# Patient Record
Sex: Male | Born: 1970 | Race: White | Hispanic: No | Marital: Single | Smoking: Current every day smoker
Health system: Southern US, Community
[De-identification: ages and names within clinical notes are randomized; demographics above are authoritative.]

## PROBLEM LIST (undated history)

## (undated) DIAGNOSIS — I10 Essential (primary) hypertension: Secondary | ICD-10-CM

## (undated) DIAGNOSIS — I2510 Atherosclerotic heart disease of native coronary artery without angina pectoris: Secondary | ICD-10-CM

## (undated) HISTORY — PX: CORONARY ANGIOPLASTY WITH STENT PLACEMENT: SHX49

---

## 2010-04-20 ENCOUNTER — Emergency Department (HOSPITAL_COMMUNITY)
Admission: EM | Admit: 2010-04-20 | Discharge: 2010-04-20 | Disposition: A | Discharge status: Home / Self Care | Payer: Self-pay | Attending: Emergency Medicine | Admitting: Emergency Medicine

## 2010-04-20 ENCOUNTER — Emergency Department (HOSPITAL_COMMUNITY): Payer: Self-pay

## 2010-04-20 DIAGNOSIS — R109 Unspecified abdominal pain: Secondary | ICD-10-CM | POA: Insufficient documentation

## 2010-04-20 DIAGNOSIS — Z87442 Personal history of urinary calculi: Secondary | ICD-10-CM | POA: Insufficient documentation

## 2010-04-20 DIAGNOSIS — R319 Hematuria, unspecified: Secondary | ICD-10-CM | POA: Insufficient documentation

## 2010-04-20 LAB — DIFFERENTIAL
Basophils Absolute: 0.1 10*3/uL (ref 0.0–0.1)
Basophils Relative: 1 % (ref 0–1)
Eosinophils Relative: 4 % (ref 0–5)
Monocytes Absolute: 0.6 10*3/uL (ref 0.1–1.0)
Monocytes Relative: 7 % (ref 3–12)
Neutro Abs: 5.5 10*3/uL (ref 1.7–7.7)

## 2010-04-20 LAB — CBC
HCT: 40.5 % (ref 39.0–52.0)
Hemoglobin: 13.3 g/dL (ref 13.0–17.0)
MCH: 29.2 pg (ref 26.0–34.0)
MCHC: 32.8 g/dL (ref 30.0–36.0)
RDW: 12.9 % (ref 11.5–15.5)

## 2010-04-20 LAB — URINALYSIS, ROUTINE W REFLEX MICROSCOPIC
Bilirubin Urine: NEGATIVE
Nitrite: NEGATIVE
Protein, ur: NEGATIVE mg/dL
Urobilinogen, UA: 0.2 mg/dL (ref 0.0–1.0)

## 2010-04-20 LAB — BASIC METABOLIC PANEL
CO2: 28 mEq/L (ref 19–32)
Calcium: 8.8 mg/dL (ref 8.4–10.5)
Creatinine, Ser: 1.22 mg/dL (ref 0.4–1.5)
GFR calc Af Amer: >60 mL/min (ref >60)
GFR calc non Af Amer: >60 mL/min (ref >60)
Glucose, Bld: 118 mg/dL — ABNORMAL HIGH (ref 70–99)
Sodium: 143 mEq/L (ref 135–145)

## 2019-07-31 ENCOUNTER — Emergency Department
Admission: EM | Admit: 2019-07-31 | Discharge: 2019-07-31 | Disposition: A | Discharge status: Home / Self Care | Payer: Self-pay | Attending: Emergency Medicine | Admitting: Emergency Medicine

## 2019-07-31 ENCOUNTER — Emergency Department: Payer: Self-pay

## 2019-07-31 ENCOUNTER — Other Ambulatory Visit: Payer: Self-pay

## 2019-07-31 DIAGNOSIS — R55 Syncope and collapse: Secondary | ICD-10-CM | POA: Insufficient documentation

## 2019-07-31 DIAGNOSIS — R42 Dizziness and giddiness: Secondary | ICD-10-CM

## 2019-07-31 DIAGNOSIS — F17210 Nicotine dependence, cigarettes, uncomplicated: Secondary | ICD-10-CM | POA: Insufficient documentation

## 2019-07-31 DIAGNOSIS — I2510 Atherosclerotic heart disease of native coronary artery without angina pectoris: Secondary | ICD-10-CM | POA: Insufficient documentation

## 2019-07-31 DIAGNOSIS — I10 Essential (primary) hypertension: Secondary | ICD-10-CM | POA: Insufficient documentation

## 2019-07-31 HISTORY — DX: Atherosclerotic heart disease of native coronary artery without angina pectoris: I25.10

## 2019-07-31 HISTORY — DX: Essential (primary) hypertension: I10

## 2019-07-31 LAB — URINALYSIS, COMPLETE (UACMP) WITH MICROSCOPIC
Bacteria, UA: NONE SEEN
Bilirubin Urine: NEGATIVE
Glucose, UA: NEGATIVE mg/dL
Hgb urine dipstick: NEGATIVE
Ketones, ur: NEGATIVE mg/dL
Leukocytes,Ua: NEGATIVE
Nitrite: NEGATIVE
Protein, ur: 30 mg/dL — AB
Specific Gravity, Urine: 1.024 (ref 1.005–1.030)
pH: 6 (ref 5.0–8.0)

## 2019-07-31 LAB — BASIC METABOLIC PANEL
Anion gap: 12 (ref 5–15)
BUN: 23 mg/dL — ABNORMAL HIGH (ref 6–20)
CO2: 27 mmol/L (ref 22–32)
Calcium: 9.3 mg/dL (ref 8.9–10.3)
Chloride: 102 mmol/L (ref 98–111)
Creatinine, Ser: 1.18 mg/dL (ref 0.61–1.24)
GFR calc Af Amer: >60 mL/min (ref >60)
GFR calc non Af Amer: >60 mL/min (ref >60)
Glucose, Bld: 110 mg/dL — ABNORMAL HIGH (ref 70–99)
Potassium: 4 mmol/L (ref 3.5–5.1)
Sodium: 141 mmol/L (ref 135–145)

## 2019-07-31 LAB — CBC
HCT: 46.1 % (ref 39.0–52.0)
Hemoglobin: 15.8 g/dL (ref 13.0–17.0)
MCH: 29.6 pg (ref 26.0–34.0)
MCHC: 34.3 g/dL (ref 30.0–36.0)
MCV: 86.3 fL (ref 80.0–100.0)
Platelets: 314 10*3/uL (ref 150–400)
RBC: 5.34 MIL/uL (ref 4.22–5.81)
RDW: 12.1 % (ref 11.5–15.5)
WBC: 10.7 10*3/uL — ABNORMAL HIGH (ref 4.0–10.5)
nRBC: 0 % (ref 0.0–0.2)

## 2019-07-31 LAB — TROPONIN I (HIGH SENSITIVITY)
Troponin I (High Sensitivity): 30 ng/L — ABNORMAL HIGH (ref <18)
Troponin I (High Sensitivity): 27 ng/L — ABNORMAL HIGH (ref <18)

## 2019-07-31 MED ORDER — SODIUM CHLORIDE 0.9% FLUSH
3.0000 mL | Freq: Once | INTRAVENOUS | Status: AC
  Administered 2019-07-31: 3 mL via INTRAVENOUS

## 2019-07-31 NOTE — ED Notes (Signed)
Pt unable to urinate at this time, given specimen cup for when is able to do so. Pt placed in [place] with two sheriffs.
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
## 2019-07-31 NOTE — ED Notes (Signed)
Patient with elevated BP. Katrinka Blazing, MD made aware.

## 2019-07-31 NOTE — ED Notes (Signed)
Patient provided a pitcher and encouraged to hydrate. He is observed drinking without any issues. He denies pain at this time but continues to c/o dizziness.

## 2019-07-31 NOTE — ED Triage Notes (Signed)
Pt comes into the ED via EMS , states the pt was in custody of Environmental consultant for probation violation and the pt c/o feeling faint . 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 Pt is a/ox4. Pt did arrive with forensic wrist restraints.

## 2019-07-31 NOTE — Discharge Instructions (Signed)
Return to the ED with any new chest pains, fevers or episodes of passing out.

## 2021-02-27 IMAGING — CT CT HEAD W/O CM
3 series · 15 of 47 positions shown, 18 images · non-contrast
Comparison: No pertinent prior studies available for comparison.

CLINICAL DATA: Head trauma, abnormal mental status.

EXAM:
CT HEAD WITHOUT CONTRAST
TECHNIQUE: Contiguous axial images were obtained from the base of the skull
through the vertex without intravenous contrast.

[Series 2: head wo · axial · 0.41mm/px · z∈[+622,+747]mm · 9 of 30 slices shown, 12 images]
[im 3/30  brain]
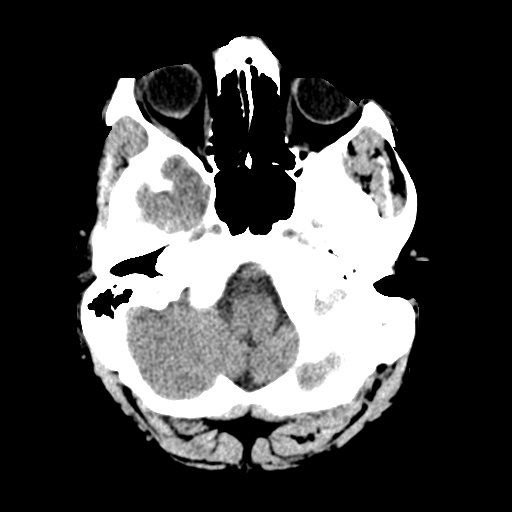
[im 3/30  bone]
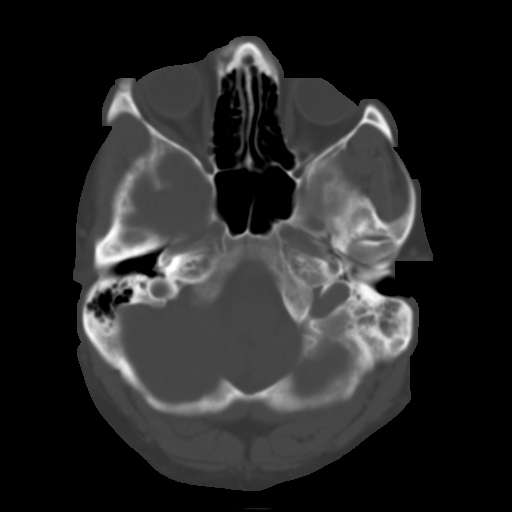
[im 6/30  brain]
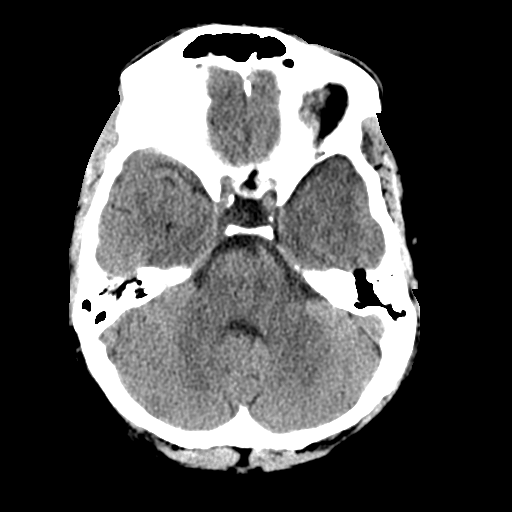
[im 9/30  brain]
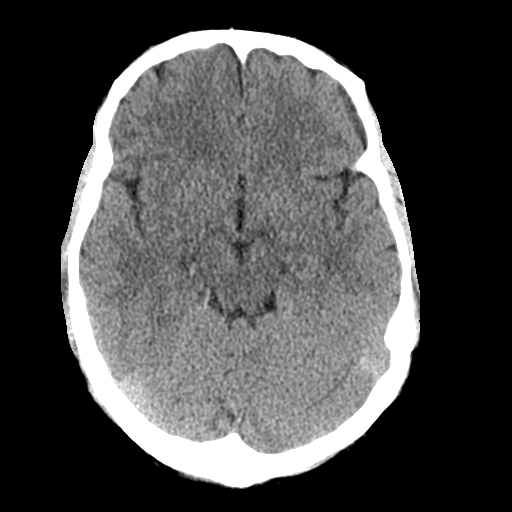
[im 12/30  brain]
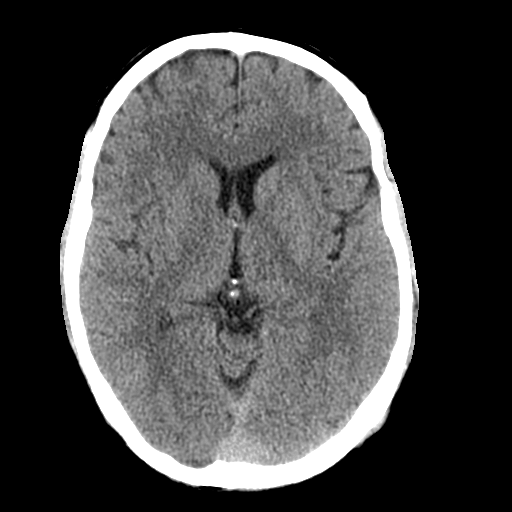
[im 16/30  brain]
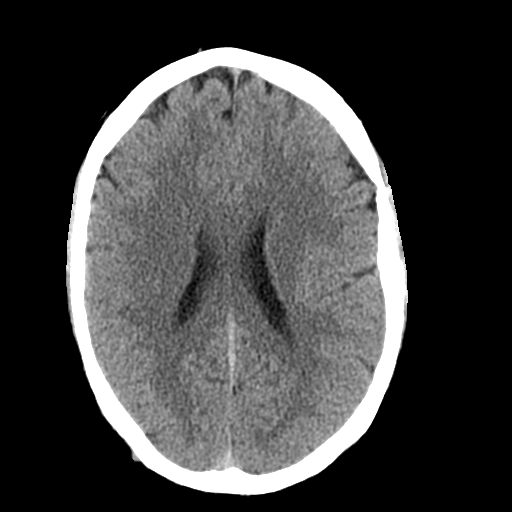
[im 16/30  bone]
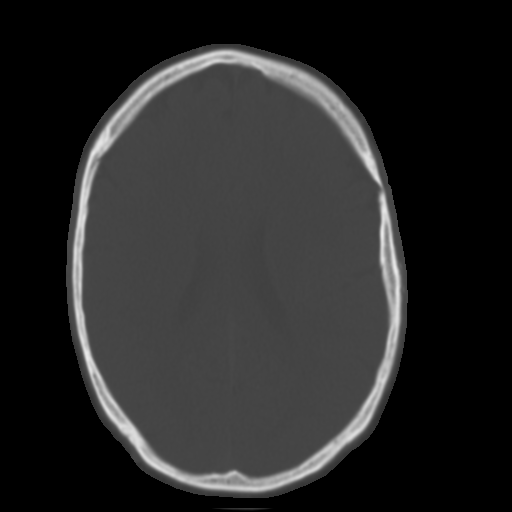
[im 19/30  brain]
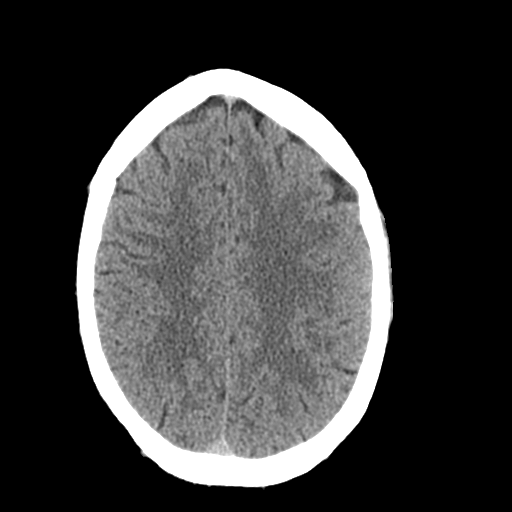
[im 22/30  brain]
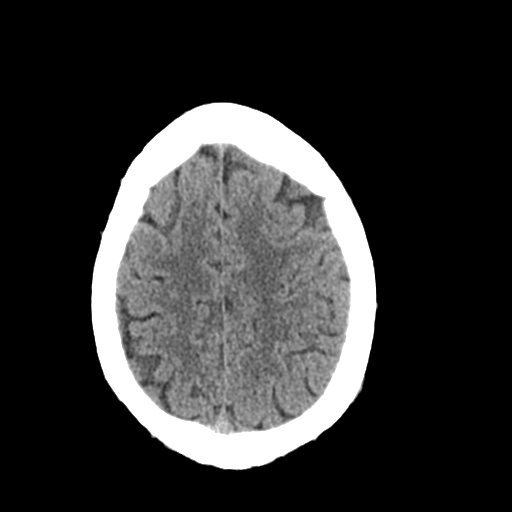
[im 25/30  brain]
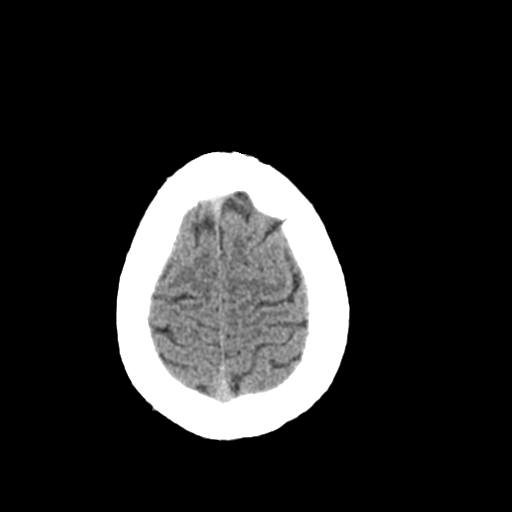
[im 28/30  brain]
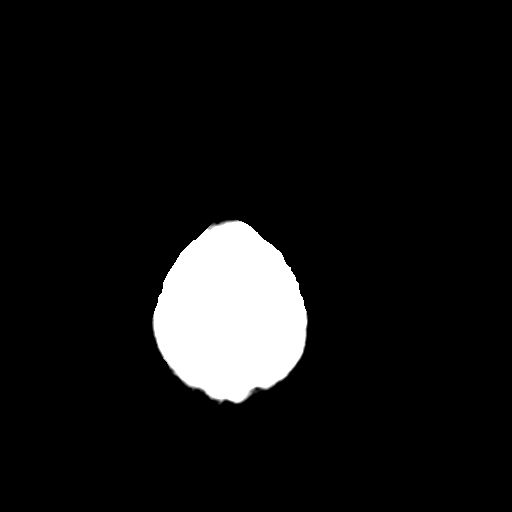
[im 28/30  bone]
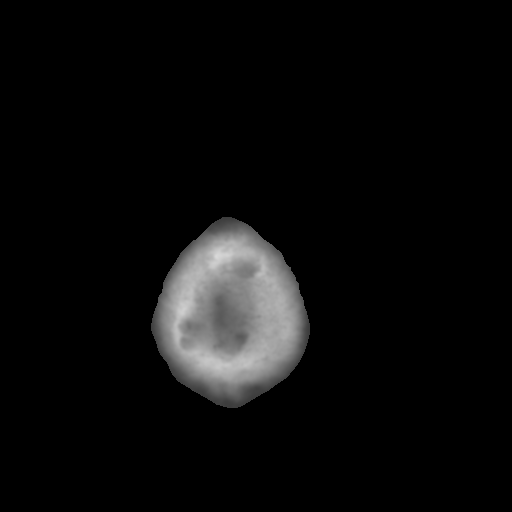

[Series 4: coronal soft tissue · coronal · 0.31mm/px · 3 of 68 slices shown]
[im 23/68  brain]
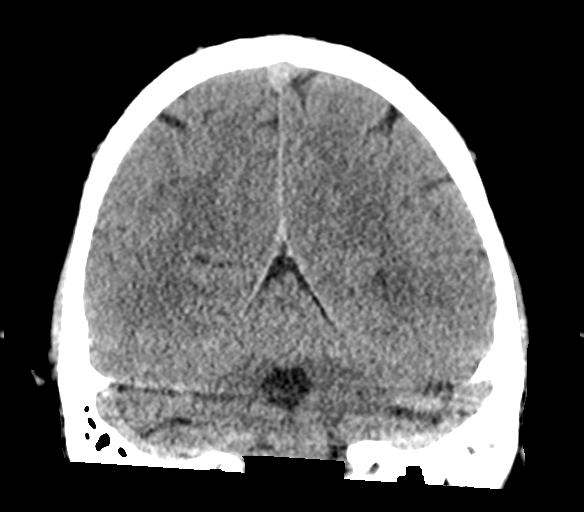
[im 30/68  brain]
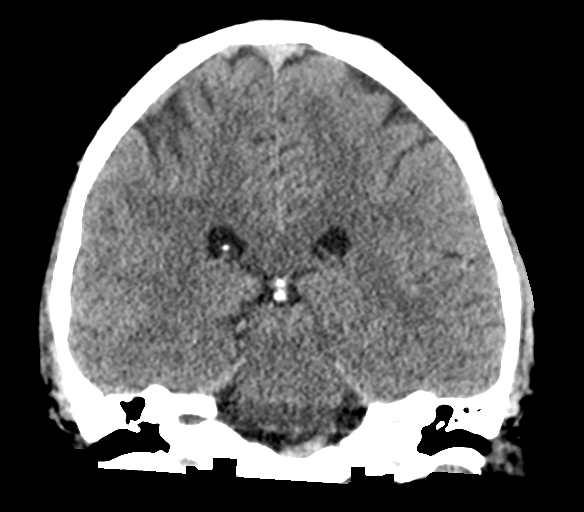
[im 38/68  brain]
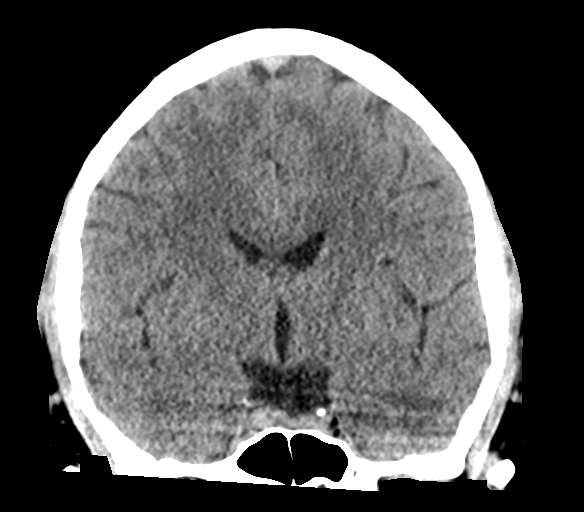

[Series 5: sagittal soft tissue · sagittal · 0.31mm/px · 3 of 60 slices shown]
[im 20/60  brain]
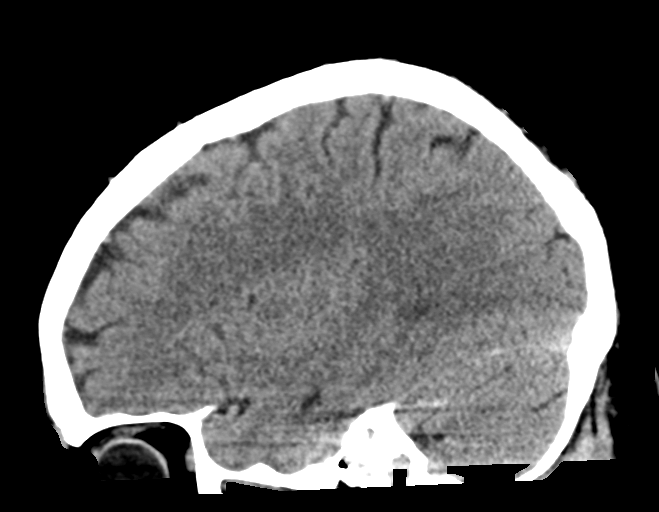
[im 30/60  brain]
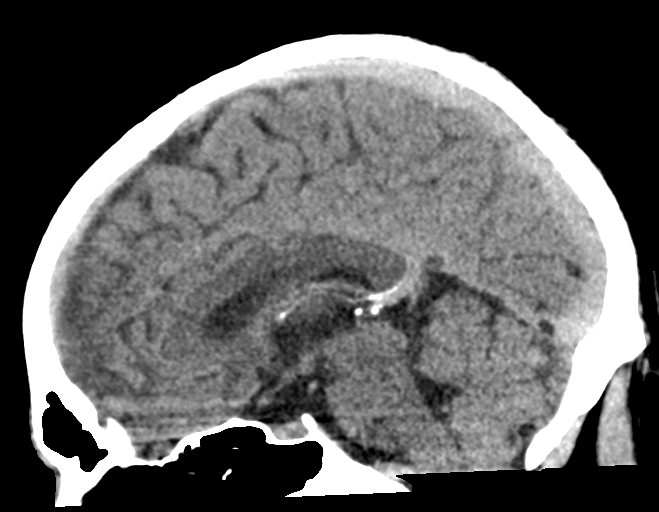
[im 40/60  brain]
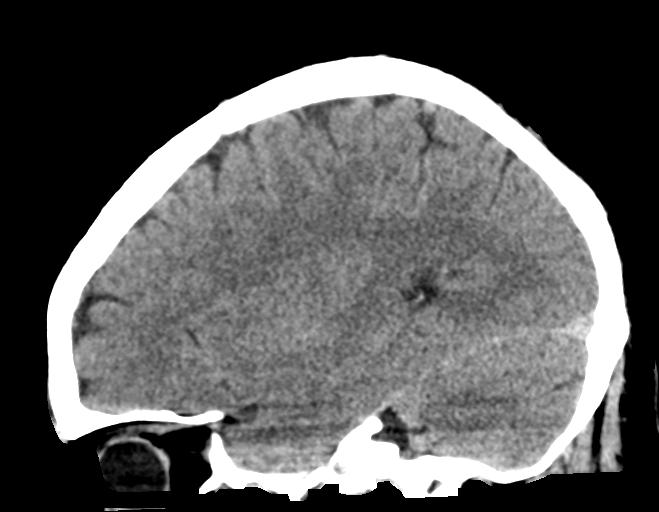

[15 of 47 positions shown; findings below may reference images not displayed]

FINDINGS: Brain:

Cerebral volume is normal.

There is no acute intracranial hemorrhage.

No demarcated cortical infarct.

No extra-axial fluid collection.

No evidence of intracranial mass.

No midline shift.

Vascular: No hyperdense vessel.

Skull: Normal. Negative for fracture or focal lesion.

Sinuses/Orbits: Visualized orbits show no acute finding. Mild
ethmoid sinus mucosal thickening. Left mastoid effusion.
IMPRESSION: Unremarkable non-contrast CT appearance of the brain. No evidence of
acute intracranial abnormality.

Mild ethmoid sinus mucosal thickening.

Left mastoid effusion.

## 2022-03-19 ENCOUNTER — Other Ambulatory Visit: Payer: Self-pay

## 2022-03-19 ENCOUNTER — Emergency Department
Admission: EM | Admit: 2022-03-19 | Discharge: 2022-03-19 | Disposition: A | Discharge status: Home / Self Care | Payer: Self-pay | Attending: Emergency Medicine | Admitting: Emergency Medicine

## 2022-03-19 DIAGNOSIS — L02414 Cutaneous abscess of left upper limb: Secondary | ICD-10-CM | POA: Insufficient documentation

## 2022-03-19 DIAGNOSIS — Z4800 Encounter for change or removal of nonsurgical wound dressing: Secondary | ICD-10-CM | POA: Insufficient documentation

## 2022-03-19 DIAGNOSIS — Z5189 Encounter for other specified aftercare: Secondary | ICD-10-CM

## 2022-03-19 MED ORDER — CEPHALEXIN 500 MG PO CAPS
500.0000 mg | ORAL_CAPSULE | Freq: Once | ORAL | Status: AC
  Administered 2022-03-19: 500 mg via ORAL
  Filled 2022-03-19: qty 1

## 2022-03-19 MED ORDER — DOXYCYCLINE MONOHYDRATE 100 MG PO TABS: 100.0000 mg | ORAL_TABLET | Freq: Two times a day (BID) | ORAL | 0 refills | Status: AC

## 2022-03-19 MED ORDER — CEPHALEXIN 500 MG PO CAPS: 500.0000 mg | ORAL_CAPSULE | Freq: Four times a day (QID) | ORAL | 0 refills | Status: AC

## 2022-03-19 MED ORDER — DOXYCYCLINE HYCLATE 100 MG PO TABS
100.0000 mg | ORAL_TABLET | Freq: Once | ORAL | Status: AC
  Administered 2022-03-19: 100 mg via ORAL
  Filled 2022-03-19: qty 1

## 2022-03-19 NOTE — Discharge Instructions (Signed)
Take Keflex 4 times daily for the next 7 days. ?Take doxycycline twice daily for the next 7 days. ?

## 2022-03-19 NOTE — ED Provider Notes (Signed)
[facility] Provider Note  Patient Contact: 11:08 PM (approximate)   History   Wound Check (Draining abscess located to patient's LEFT forearm)   HPI  Jeffrey Trevino is a 52 y.o. male presents to the emergency department with a spontaneously draining abscess of the left forearm.  Patient states that symptoms have been present for the past 2 to 3 days.  No prior history of MRSA.  No fever or chills at home.  Patient reports that he has been working on a car more recently but has not had any other new activities in his life.  No recent admissions.      Physical Exam   Triage Vital Signs: ED Triage Vitals [03/19/22 2220]  Enc Vitals Group     BP (!) 162/98     Pulse Rate 82     Resp 19     Temp 98.1 F (36.7 C)     Temp src      SpO2 97 %     Weight 215 lb (97.5 kg)     Height '5\' 11"'$  (1.803 m)     Head Circumference      Peak Flow      Pain Score 8     Pain Loc      Pain Edu?      Excl. in [place]?     Most recent vital signs: Vitals:   03/19/22 2220  BP: (!) 162/98  Pulse: 82  Resp: 19  Temp: 98.1 F (36.7 C)  SpO2: 97%     General: Alert and in no acute distress. Eyes:  PERRL. EOMI. Head: No acute traumatic findings ENT:      Nose: No congestion/rhinnorhea.      Mouth/Throat: Mucous membranes are moist.  Neck: No stridor. No cervical spine tenderness to palpation. Cardiovascular:  Good peripheral perfusion Respiratory: Normal respiratory effort without tachypnea or retractions. Lungs CTAB. Good air entry to the bases with no decreased or absent breath sounds. Gastrointestinal: Bowel sounds 4 quadrants. Soft and nontender to palpation. No guarding or rigidity. No palpable masses. No distention. No CVA tenderness. Musculoskeletal: Full range of motion to all extremities.  Neurologic:  No gross focal neurologic deficits are appreciated.  Skin: Patient has a 3 cm x 3 cm region of erythema along the left forearm with spontaneous drainage.   No streaking.    ED Results / Procedures / Treatments   Labs (all labs ordered are listed, but only abnormal results are displayed) Labs Reviewed - No data to display      PROCEDURES:  Critical Care performed: No  Procedures   MEDICATIONS ORDERED IN ED: Medications  doxycycline (VIBRA-TABS) tablet 100 mg (100 mg Oral Given 03/19/22 2305)  cephALEXin (KEFLEX) capsule 500 mg (500 mg Oral Given 03/19/22 2305)     IMPRESSION / MDM / ASSESSMENT AND PLAN / ED COURSE  I reviewed the triage vital signs and the nursing notes.                              Assessment and plan Abscess 52 year old male presents to the emergency department with a 3 cm x 3 cm region of erythema and spontaneous drainage along the left forearm.  Patient was hypertensive at triage.  Patient was alert, active and nontoxic-appearing.  Will treat patient with both doxycycline and Keflex.  Do not feel that incision and drainage is warranted at this time given spontaneous drainage.  Return precautions were given to return to the emergency department with new or worsening symptoms.  All patient questions were answered.      FINAL CLINICAL IMPRESSION(S) / ED DIAGNOSES   Final diagnoses:  Wound check, abscess     Rx / DC Orders   ED Discharge Orders          Ordered    doxycycline (ADOXA) 100 MG tablet  2 times daily        03/19/22 2256    cephALEXin (KEFLEX) 500 MG capsule  4 times daily        03/19/22 2256             Note:  This document was prepared using Dragon voice recognition software and may include unintentional dictation errors.   Vallarie Mare [place], PA-C 03/19/22 2311    Harvest Dark, MD 03/23/22 1358

## 2022-03-19 NOTE — ED Triage Notes (Signed)
Draining abscess located to patient's LEFT forearm

## 2022-05-30 ENCOUNTER — Other Ambulatory Visit: Payer: Self-pay

## 2022-05-30 ENCOUNTER — Emergency Department (HOSPITAL_COMMUNITY): Payer: Self-pay

## 2022-05-30 ENCOUNTER — Emergency Department (HOSPITAL_COMMUNITY)
Admission: EM | Admit: 2022-05-30 | Discharge: 2022-05-30 | Disposition: A | Discharge status: Home / Self Care | Payer: Self-pay | Attending: Emergency Medicine | Admitting: Emergency Medicine

## 2022-05-30 ENCOUNTER — Encounter (HOSPITAL_COMMUNITY): Payer: Self-pay | Admitting: Emergency Medicine

## 2022-05-30 DIAGNOSIS — M545 Low back pain, unspecified: Secondary | ICD-10-CM | POA: Diagnosis not present

## 2022-05-30 DIAGNOSIS — R102 Pelvic and perineal pain: Secondary | ICD-10-CM | POA: Insufficient documentation

## 2022-05-30 DIAGNOSIS — R079 Chest pain, unspecified: Secondary | ICD-10-CM | POA: Diagnosis present

## 2022-05-30 DIAGNOSIS — S2231XA Fracture of one rib, right side, initial encounter for closed fracture: Secondary | ICD-10-CM

## 2022-05-30 DIAGNOSIS — R1010 Upper abdominal pain, unspecified: Secondary | ICD-10-CM | POA: Insufficient documentation

## 2022-05-30 LAB — CBC WITH DIFFERENTIAL/PLATELET
Abs Immature Granulocytes: 0.06 10*3/uL (ref 0.00–0.07)
Basophils Absolute: 0.1 10*3/uL (ref 0.0–0.1)
Basophils Relative: 1 %
Eosinophils Absolute: 0.3 10*3/uL (ref 0.0–0.5)
Eosinophils Relative: 3 %
HCT: 43.1 % (ref 39.0–52.0)
Hemoglobin: 14.9 g/dL (ref 13.0–17.0)
Immature Granulocytes: 1 %
Lymphocytes Relative: 23 %
Lymphs Abs: 2.3 10*3/uL (ref 0.7–4.0)
MCH: 29.7 pg (ref 26.0–34.0)
MCHC: 34.6 g/dL (ref 30.0–36.0)
MCV: 86 fL (ref 80.0–100.0)
Monocytes Absolute: 0.9 10*3/uL (ref 0.1–1.0)
Monocytes Relative: 9 %
Neutro Abs: 6.5 10*3/uL (ref 1.7–7.7)
Neutrophils Relative %: 63 %
Platelets: 378 10*3/uL (ref 150–400)
RBC: 5.01 MIL/uL (ref 4.22–5.81)
RDW: 12.5 % (ref 11.5–15.5)
WBC: 10.1 10*3/uL (ref 4.0–10.5)
nRBC: 0 % (ref 0.0–0.2)

## 2022-05-30 LAB — BASIC METABOLIC PANEL
Anion gap: 10 (ref 5–15)
BUN: 22 mg/dL — ABNORMAL HIGH (ref 6–20)
CO2: 25 mmol/L (ref 22–32)
Calcium: 8.8 mg/dL — ABNORMAL LOW (ref 8.9–10.3)
Chloride: 101 mmol/L (ref 98–111)
Creatinine, Ser: 1.04 mg/dL (ref 0.61–1.24)
GFR, Estimated: >60 mL/min (ref >60)
Glucose, Bld: 114 mg/dL — ABNORMAL HIGH (ref 70–99)
Potassium: 3.9 mmol/L (ref 3.5–5.1)
Sodium: 136 mmol/L (ref 135–145)

## 2022-05-30 MED ORDER — HYDROCODONE-ACETAMINOPHEN 5-325 MG PO TABS: 1.0000 | ORAL_TABLET | Freq: Four times a day (QID) | ORAL | 0 refills | Status: AC | PRN

## 2022-05-30 MED ORDER — SODIUM CHLORIDE 0.9 % IV BOLUS
1000.0000 mL | Freq: Once | INTRAVENOUS | Status: AC
  Administered 2022-05-30: 1000 mL via INTRAVENOUS

## 2022-05-30 MED ORDER — IOHEXOL 300 MG/ML  SOLN
100.0000 mL | Freq: Once | INTRAMUSCULAR | Status: AC | PRN
  Administered 2022-05-30: 100 mL via INTRAVENOUS
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
## 2022-05-30 NOTE — Discharge Instructions (Signed)
Begin taking ibuprofen 600 mg every 6 hours as needed for pain.  Begin taking hydrocodone as prescribed as needed for pain not relieved with ibuprofen.  Follow-up with primary doctor if not improving in the next 1 to 2 weeks.
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
## 2023-03-18 ENCOUNTER — Emergency Department
Admission: EM | Admit: 2023-03-18 | Discharge: 2023-03-19 | Disposition: A | Discharge status: Home / Self Care | Attending: Emergency Medicine | Admitting: Emergency Medicine

## 2023-03-18 ENCOUNTER — Emergency Department

## 2023-03-18 ENCOUNTER — Other Ambulatory Visit: Payer: Self-pay

## 2023-03-18 ENCOUNTER — Encounter: Payer: Self-pay | Admitting: Emergency Medicine

## 2023-03-18 DIAGNOSIS — T402X1A Poisoning by other opioids, accidental (unintentional), initial encounter: Secondary | ICD-10-CM | POA: Insufficient documentation

## 2023-03-18 DIAGNOSIS — F17200 Nicotine dependence, unspecified, uncomplicated: Secondary | ICD-10-CM | POA: Diagnosis not present

## 2023-03-18 DIAGNOSIS — E119 Type 2 diabetes mellitus without complications: Secondary | ICD-10-CM | POA: Insufficient documentation

## 2023-03-18 DIAGNOSIS — I2510 Atherosclerotic heart disease of native coronary artery without angina pectoris: Secondary | ICD-10-CM | POA: Diagnosis not present

## 2023-03-18 DIAGNOSIS — X58XXXA Exposure to other specified factors, initial encounter: Secondary | ICD-10-CM | POA: Diagnosis not present

## 2023-03-18 DIAGNOSIS — I10 Essential (primary) hypertension: Secondary | ICD-10-CM | POA: Insufficient documentation

## 2023-03-18 DIAGNOSIS — T50901A Poisoning by unspecified drugs, medicaments and biological substances, accidental (unintentional), initial encounter: Secondary | ICD-10-CM | POA: Diagnosis present

## 2023-03-18 DIAGNOSIS — T40601A Poisoning by unspecified narcotics, accidental (unintentional), initial encounter: Secondary | ICD-10-CM

## 2023-03-18 LAB — CBC WITH DIFFERENTIAL/PLATELET
Abs Immature Granulocytes: 0.04 10*3/uL (ref 0.00–0.07)
Basophils Absolute: 0 10*3/uL (ref 0.0–0.1)
Basophils Relative: 0 %
Eosinophils Absolute: 0.3 10*3/uL (ref 0.0–0.5)
Eosinophils Relative: 2 %
HCT: 52 % (ref 39.0–52.0)
Hemoglobin: 17.3 g/dL — ABNORMAL HIGH (ref 13.0–17.0)
Immature Granulocytes: 0 %
Lymphocytes Relative: 24 %
Lymphs Abs: 2.7 10*3/uL (ref 0.7–4.0)
MCH: 29.4 pg (ref 26.0–34.0)
MCHC: 33.3 g/dL (ref 30.0–36.0)
MCV: 88.4 fL (ref 80.0–100.0)
Monocytes Absolute: 1 10*3/uL (ref 0.1–1.0)
Monocytes Relative: 9 %
Neutro Abs: 7.2 10*3/uL (ref 1.7–7.7)
Neutrophils Relative %: 65 %
Platelets: 316 10*3/uL (ref 150–400)
RBC: 5.88 MIL/uL — ABNORMAL HIGH (ref 4.22–5.81)
RDW: 11.9 % (ref 11.5–15.5)
WBC: 11.1 10*3/uL — ABNORMAL HIGH (ref 4.0–10.5)
nRBC: 0 % (ref 0.0–0.2)

## 2023-03-18 LAB — BASIC METABOLIC PANEL
Anion gap: 9 (ref 5–15)
BUN: 22 mg/dL — ABNORMAL HIGH (ref 6–20)
CO2: 26 mmol/L (ref 22–32)
Calcium: 9.1 mg/dL (ref 8.9–10.3)
Chloride: 104 mmol/L (ref 98–111)
Creatinine, Ser: 1.33 mg/dL — ABNORMAL HIGH (ref 0.61–1.24)
GFR, Estimated: >60 mL/min (ref >60)
Glucose, Bld: 172 mg/dL — ABNORMAL HIGH (ref 70–99)
Potassium: 5 mmol/L (ref 3.5–5.1)
Sodium: 139 mmol/L (ref 135–145)

## 2023-03-18 MED ORDER — ONDANSETRON HCL 4 MG/2ML IJ SOLN
4.0000 mg | Freq: Once | INTRAMUSCULAR | Status: AC
  Administered 2023-03-18: 4 mg via INTRAVENOUS
  Filled 2023-03-18: qty 2

## 2023-03-18 MED ORDER — LACTATED RINGERS IV BOLUS
1000.0000 mL | Freq: Once | INTRAVENOUS | Status: AC
  Administered 2023-03-18: 1000 mL via INTRAVENOUS

## 2023-03-18 MED ORDER — IPRATROPIUM-ALBUTEROL 0.5-2.5 (3) MG/3ML IN SOLN
3.0000 mL | Freq: Once | RESPIRATORY_TRACT | Status: AC
  Administered 2023-03-18: 3 mL via RESPIRATORY_TRACT
  Filled 2023-03-18: qty 3

## 2023-03-18 MED ORDER — NALOXONE HCL 2 MG/2ML IJ SOSY
0.4000 mg | PREFILLED_SYRINGE | Freq: Once | INTRAMUSCULAR | Status: AC
  Administered 2023-03-18: 0.4 mg via INTRAVENOUS
  Filled 2023-03-18: qty 2

## 2023-03-18 MED ORDER — METHYLPREDNISOLONE SODIUM SUCC 125 MG IJ SOLR
125.0000 mg | Freq: Once | INTRAMUSCULAR | Status: AC
  Administered 2023-03-18: 125 mg via INTRAVENOUS
  Filled 2023-03-18: qty 2

## 2023-03-18 NOTE — ED Provider Notes (Signed)
 [facility] Provider Note    Event Date/Time   First MD Initiated Contact with Patient 03/18/23 2035     (approximate)   History   No chief complaint on file.   HPI  Jeffrey Trevino is a 53 y.o. male who presents to the ED for evaluation of No chief complaint on file.   I review a [facility] medical admission from November, admitted for 2 days in the setting of new onset diabetes and an AKI.  History of CAD with a bare-metal stent in place, HTN, HLD, tobacco abuse and likely COPD.  Patient presents to the ED via EMS from the field for possible overdose.  He was in town visiting family, reportedly became unresponsive and responded to Narcan immediately waking up.  Patient reports he does not know what happened.  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  Family or friend on the scene told EMS that he used heroin tonight.   Physical Exam   Triage Vital Signs: ED Triage Vitals  Encounter Vitals Group     BP      Systolic BP Percentile      Diastolic BP Percentile      Pulse      Resp      Temp      Temp src      SpO2      Weight      Height      Head Circumference      Peak Flow      Pain Score      Pain Loc      Pain Education      Exclude from Growth Chart     Most recent vital signs: Vitals:   03/18/23 2215 03/18/23 2230  BP:    Pulse: 81 81  Resp: 15 14  Temp:    SpO2: 96% 95%    General: Awake, no distress.  Small/midrange pupils and PERRL.  Somewhat sleepy but able to provide a full history, speaking in full sentences CV:  Good peripheral perfusion.  Resp:  Normal effort.  Wheezing without tachypnea or dyspnea.  Respiratory rate 15-20 Abd:  No distention.  Soft MSK:  No deformity noted.  Palpation of all 4 extremities without evidence of deformity, tenderness or trauma Neuro:  No focal deficits appreciated. Cranial nerves II through XII intact 5/5 strength and sensation in all 4 extremities Other:     ED  Results / Procedures / Treatments   Labs (all labs ordered are listed, but only abnormal results are displayed) Labs Reviewed  CBC WITH DIFFERENTIAL/PLATELET - Abnormal; Notable for the following components:      Result Value   WBC 11.1 (*)    RBC 5.88 (*)    Hemoglobin 17.3 (*)    All other components within normal limits  BASIC METABOLIC PANEL - Abnormal; Notable for the following components:   Glucose, Bld 172 (*)    BUN 22 (*)    Creatinine, Ser 1.33 (*)    All other components within normal limits  BLOOD GAS, VENOUS    EKG Sinus rhythm with a rate of 58 bpm.  Leftward axis and normal intervals.  No signs of acute ischemia.  RADIOLOGY CXR interpreted by me without evidence of acute cardiopulmonary pathology. CT head interpreted by me without evidence of acute intracranial pathology   Official radiology report(s): DG Chest Portable 1 View Result Date: 03/18/2023 CLINICAL DATA:  COPD.  Poorly responsive. EXAM:  PORTABLE CHEST 1 VIEW COMPARISON:  Chest CT 05/30/2022 FINDINGS: Low lung volumes limit assessment. The heart appears enlarged. Minimal peribronchial thickening without focal airspace disease. No pneumothorax or large pleural effusion. No evidence of pulmonary edema on this low volume exam. IMPRESSION: 1. Low lung volumes limit assessment. 2. Cardiomegaly. Minimal peribronchial thickening without focal airspace disease. Electronically Signed   By: Narda Rutherford M.D.   On: 03/18/2023 21:59   CT HEAD WO CONTRAST ( ) Result Date: 03/18/2023 CLINICAL DATA:  poorly responsive, eval cva, ich EXAM: CT HEAD WITHOUT CONTRAST TECHNIQUE: Contiguous axial images were obtained from the base of the skull through the vertex without intravenous contrast. RADIATION DOSE REDUCTION: This exam was performed according to the departmental dose-optimization program which includes automated exposure control, adjustment of the mA and/or kV according to patient size and/or use of iterative  reconstruction technique. COMPARISON:  Head CT 07/31/2019 FINDINGS: Brain: No intracranial hemorrhage, mass effect, or midline shift. No hydrocephalus. The basilar cisterns are patent. No evidence of territorial infarct or acute ischemia. No extra-axial or intracranial fluid collection. Vascular: Atherosclerosis of skullbase vasculature without hyperdense vessel or abnormal calcification. Skull: No fracture or focal lesion. Sinuses/Orbits: Unchanged left mastoid effusion.  No acute findings. Other: None. IMPRESSION: 1.  No acute intracranial abnormality. 2. Chronic left mastoid effusion. Electronically Signed   By: Narda Rutherford M.D.   On: 03/18/2023 21:51    PROCEDURES and INTERVENTIONS:  .Critical Care  Performed by: Delton Prairie, MD Authorized by: Delton Prairie, MD   Critical care provider statement:    Critical care time (minutes):  30   Critical care time was exclusive of:  Separately billable procedures and treating other patients   Critical care was necessary to treat or prevent imminent or life-threatening deterioration of the following conditions:  CNS failure or compromise and toxidrome   Critical care was time spent personally by me on the following activities:  Development of treatment plan with patient or surrogate, discussions with consultants, evaluation of patient's response to treatment, examination of patient, ordering and review of laboratory studies, ordering and review of radiographic studies, ordering and performing treatments and interventions, pulse oximetry, re-evaluation of patient's condition and review of old charts .1-3 Lead EKG Interpretation  Performed by: Delton Prairie, MD Authorized by: Delton Prairie, MD     Interpretation: normal     ECG rate:  80   ECG rate assessment: normal     Rhythm: sinus rhythm     Ectopy: none     Conduction: normal     Medications  naloxone [facility]) injection 0.4 mg (0.4 mg Intravenous Given 03/18/23 2058)  ondansetron (ZOFRAN)  injection 4 mg (4 mg Intravenous Given 03/18/23 2059)  lactated ringers bolus 1,000 mL (1,000 mLs Intravenous New Bag/Given 03/18/23 2105)  ipratropium-albuterol (DUONEB) 0.5-2.5 (3) MG/3ML nebulizer solution 3 mL (3 mLs Nebulization Given 03/18/23 2059)  methylPREDNISolone sodium succinate (SOLU-MEDROL) 125 mg/2 mL injection 125 mg (125 mg Intravenous Given 03/18/23 2059)     IMPRESSION / MDM / ASSESSMENT AND PLAN / ED COURSE  I reviewed the triage vital signs and the nursing notes.  Differential diagnosis includes, but is not limited to, polysubstance overdose, opiate overdose, intentional overdose, hypercarbia, metabolic cephalopathy  {Patient presents with symptoms of an acute illness or injury that is potentially life-threatening.  Patient presents to the ED after a reported opiate overdose, responded to Narcan.  On arrival to the ED he still somewhat encephalopathic but no further improvement with Narcan.  Strongest likelihood of  a polysubstance overdose that requires metabolization.  CBC and metabolic panel are unrevealing.  CT head and CXR clear.  We will add on a venous blood gas and he is signed out to oncoming physician to follow-up on the course of this patient, if he metabolizes he may be suitable for outpatient management.  Clinical Course as of 03/18/23 2315  Sat Mar 18, 2023  2053 Reassessed.  Falling asleep in the middle of our conversation with pinpoint pupils.  Will provide more Narcan [DS]  2122 Reassessed, breathing 17-20 times per minute but still quite sleepy.  We will add on a CT scan of the head [DS]  2138 reassessed [DS]    Clinical Course User Index [DS] Delton Prairie, MD     FINAL CLINICAL IMPRESSION(S) / ED DIAGNOSES   Final diagnoses:  Opiate overdose, accidental or unintentional, initial encounter [facility])     Rx / DC Orders   ED Discharge Orders     None        Note:  This document was prepared using Dragon voice recognition software and may include  unintentional dictation errors.   Delton Prairie, MD 03/18/23 [phone]

## 2023-03-18 NOTE — ED Triage Notes (Signed)
 To ER via EMS for reported overdose. 2mg  IN Narcan administered prior to EMS arrival at 28. Patient alert and talking on arrival. Arrived in soaked clothes.

## 2023-03-19 MED ORDER — IPRATROPIUM-ALBUTEROL 0.5-2.5 (3) MG/3ML IN SOLN
RESPIRATORY_TRACT | Status: AC
  Filled 2023-03-19: qty 3

## 2023-03-19 MED ORDER — NALOXONE HCL 4 MG/0.1ML NA LIQD: NASAL | 0 refills | Status: AC

## 2023-03-19 NOTE — ED Notes (Signed)
 Pt sister contacted for pt to have transport home

## 2023-03-19 NOTE — ED Notes (Signed)
 Pt provided blue scrubs and non skid socks for home pt remains aox4 speaking in full clear sentences skin appears wdi pink non labored resps noted equal bilateral chest rise and fall noted pt denies further needs pt noted to ambulate in upright steady unassisted gait

## 2023-03-19 NOTE — ED Notes (Signed)
 Pt oriented to self pt opens eyes occasionally and appears lethargic

## 2023-03-19 NOTE — ED Notes (Signed)
 Pt sister Dewayne Hatch @ [phone]  to pick pt up from ED in 45 minutes from now pt updated pt aox4 awake swallowing liquid without apparent difficulty denies further needs VSS

## 2023-03-19 NOTE — ED Provider Notes (Addendum)
 3:01 AM Reassessed at approximately 1 AM and the patient is still quite somnolent wakes and answers questions appropriately but then falls back asleep quickly.  Remains on cardiac monitoring, will reassess in the morning.   5:11 AM Much better now.  Wakeful carries a full conversation denies any complaints.  Asking for meal.  He lives with his uncle locally and will be going home with him.  Plan be for discharge.  Will send a Narcan kit to pharmacy for him.  Pilar Jarvis, MD 03/19/23 [id number]    Pilar Jarvis, MD 03/19/23 [phone]

## 2023-03-20 LAB — BLOOD GAS, VENOUS
Acid-Base Excess: 2.6 mmol/L — ABNORMAL HIGH (ref 0.0–2.0)
Bicarbonate: 29.7 mmol/L — ABNORMAL HIGH (ref 20.0–28.0)
O2 Saturation: 92.7 %
Patient temperature: 37
pCO2, Ven: 55 mmHg (ref 44–60)
pH, Ven: 7.34 (ref 7.25–7.43)
pO2, Ven: 64 mmHg — ABNORMAL HIGH (ref 32–45)
